# Patient Record
Sex: Male | Born: 1995 | Race: Black or African American | Hispanic: No | Marital: Single | Smoking: Current every day smoker
Health system: Southern US, Community
[De-identification: ages and names within clinical notes are randomized; demographics above are authoritative.]

---
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
## 2016-01-08 ENCOUNTER — Emergency Department (HOSPITAL_COMMUNITY)

## 2016-01-08 ENCOUNTER — Emergency Department (HOSPITAL_COMMUNITY)
Admission: EM | Admit: 2016-01-08 | Discharge: 2016-01-08 | Disposition: A | Discharge status: Home / Self Care | Attending: Emergency Medicine | Admitting: Emergency Medicine

## 2016-01-08 ENCOUNTER — Encounter (HOSPITAL_COMMUNITY): Payer: Self-pay | Admitting: *Deleted

## 2016-01-08 DIAGNOSIS — Y998 Other external cause status: Secondary | ICD-10-CM | POA: Insufficient documentation

## 2016-01-08 DIAGNOSIS — W108XXA Fall (on) (from) other stairs and steps, initial encounter: Secondary | ICD-10-CM | POA: Insufficient documentation

## 2016-01-08 DIAGNOSIS — Y9389 Activity, other specified: Secondary | ICD-10-CM | POA: Insufficient documentation

## 2016-01-08 DIAGNOSIS — W19XXXA Unspecified fall, initial encounter: Secondary | ICD-10-CM

## 2016-01-08 DIAGNOSIS — M25551 Pain in right hip: Secondary | ICD-10-CM

## 2016-01-08 DIAGNOSIS — S31010A Laceration without foreign body of lower back and pelvis without penetration into retroperitoneum, initial encounter: Secondary | ICD-10-CM | POA: Insufficient documentation

## 2016-01-08 DIAGNOSIS — IMO0002 Reserved for concepts with insufficient information to code with codable children: Secondary | ICD-10-CM

## 2016-01-08 DIAGNOSIS — F17210 Nicotine dependence, cigarettes, uncomplicated: Secondary | ICD-10-CM | POA: Insufficient documentation

## 2016-01-08 DIAGNOSIS — Z23 Encounter for immunization: Secondary | ICD-10-CM | POA: Insufficient documentation

## 2016-01-08 DIAGNOSIS — S79922A Unspecified injury of left thigh, initial encounter: Secondary | ICD-10-CM | POA: Insufficient documentation

## 2016-01-08 DIAGNOSIS — Y9289 Other specified places as the place of occurrence of the external cause: Secondary | ICD-10-CM | POA: Insufficient documentation

## 2016-01-08 DIAGNOSIS — S79912A Unspecified injury of left hip, initial encounter: Secondary | ICD-10-CM | POA: Insufficient documentation

## 2016-01-08 MED ORDER — IBUPROFEN 800 MG PO TABS
800.0000 mg | ORAL_TABLET | Freq: Once | ORAL | Status: AC
  Administered 2016-01-08: 800 mg via ORAL
  Filled 2016-01-08: qty 1

## 2016-01-08 MED ORDER — TETANUS-DIPHTH-ACELL PERTUSSIS 5-2.5-18.5 LF-MCG/0.5 IM SUSP
0.5000 mL | Freq: Once | INTRAMUSCULAR | Status: AC
  Administered 2016-01-08: 0.5 mL via INTRAMUSCULAR
  Filled 2016-01-08: qty 0.5

## 2016-01-08 MED ORDER — IBUPROFEN 800 MG PO TABS: 800.0000 mg | ORAL_TABLET | Freq: Three times a day (TID) | ORAL | Status: AC

## 2016-01-08 MED ORDER — LIDOCAINE-EPINEPHRINE (PF) 2 %-1:200000 IJ SOLN
20.0000 mL | Freq: Once | INTRAMUSCULAR | Status: AC
  Administered 2016-01-08: 20 mL
  Filled 2016-01-08: qty 20

## 2016-01-08 NOTE — ED Notes (Signed)
Pt reports fall while going down the steps.  Does not know how many steps.  Pt reports etoh was on board.  It happened around 0200.  Pt presents with lac to L flank area and bruising noted on L lateral thigh.  Pt is able to ambulate

## 2016-01-08 NOTE — Discharge Instructions (Signed)
Take your medications as prescribed as needed for pain relief. I recommend continuing to keep wound clean using soap and water and dry. He may apply antibiotic ointment to wound daily. I also recommend applying ice to her right hip for 15-20 minutes 3-4 times daily as needed for pain relief. Return to the emergency department in 7 days for suture removal. Return to the emergency department sooner if symptoms worsen or new onset of fever, redness, swelling, drainage, warmth.

## 2016-01-08 NOTE — ED Provider Notes (Signed)
CSN: [id number]     Arrival date & time 01/08/16  1622 History  By signing my name below, I, Soijett Blue, attest that this documentation has been prepared under the direction and in the presence of Melburn Hake, PA-C Electronically Signed: Soijett Blue, ED Scribe. 01/08/2016. 7:12 PM.   Chief Complaint  Patient presents with  . Fall  . Laceration      The history is provided by the patient. No language interpreter was used.    Ronnie Foster is a 20 y.o. male who presents to the Emergency Department complaining of fall onset last night at 1 AM. He notes that he tripped and fell while going down an unknown amount of steps. He is unsure of what he cut his left flank on. He notes that he was drinking prior to the incident. He is having associated symptoms of laceration to left flank area and bruising noted to his left lateral thigh. He states that his left lateral thigh pain is worsened with lifting his leg up. He notes that he has not tried any medications for the relief of his symptoms. He denies hitting his head, LOC, fever, drainage, numbness, tingling, HA, back pain, neck pain, abdominal pain, n/v, difficulty urinating, and any other symptoms. Denies allergies or medical issues.   History reviewed. No pertinent past medical history. History reviewed. No pertinent past surgical history. No family history on file. Social History  Substance Use Topics  . Smoking status: Current Every Day Smoker -- 0.00 packs/day    Types: Cigarettes  . Smokeless tobacco: None  . Alcohol Use: Yes     Comment: occasional    Review of Systems  Constitutional: Negative for fever.  Gastrointestinal: Negative for nausea, vomiting and abdominal pain.  Genitourinary: Negative for difficulty urinating.  Musculoskeletal: Negative for back pain and neck pain.  Skin: Positive for color change and wound.  Neurological: Negative for weakness, numbness and headaches.       No tingling  All other systems reviewed  and are negative.    Allergies  Review of patient's allergies indicates no known allergies.  Home Medications   Prior to Admission medications   Medication Sig Start Date End Date Taking? Authorizing Provider  ibuprofen (ADVIL,MOTRIN) 800 MG tablet Take 1 tablet (800 mg total) by mouth 3 (three) times daily. 01/08/16   Satira Sark Pepper Kerrick, PA-C   BP 127/64 mmHg  Pulse 95  Temp(Src) 98.7 F (37.1 C) (Oral)  Resp 18  Ht  (1.727 m)  Wt 81.647 kg  BMI 27.38 kg/m2  SpO2 98% Physical Exam  Constitutional: He is oriented to person, place, and time. He appears well-developed and well-nourished. No distress.  HENT:  Head: Normocephalic and atraumatic.  Eyes: EOM are normal.  Neck: Neck supple.  Cardiovascular: Normal rate, regular rhythm and normal heart sounds.  Exam reveals no gallop and no friction rub.   No murmur heard. Pulmonary/Chest: Effort normal and breath sounds normal. No respiratory distress. He has no wheezes. He has no rales.  Abdominal: Soft. There is no tenderness. There is no CVA tenderness. Hernia confirmed negative in the right inguinal area and confirmed negative in the left inguinal area.  No pelvic instability.  Musculoskeletal:       Left hip: He exhibits decreased range of motion and tenderness.       Left knee: Normal.       Left ankle: Normal.  5 cm linear laceration noted to left lower lateral back. No active bleeding. No  surrounding swelling, redness, warmth, or drainage.  Left anterior hip TTP. Decreased ROM of hip with flexion and extension due to pain. Full abduction and adduction internal and external rotation. Sensation intact. 2+ pt pulses. Decreased strength due to pain. Pt able to stand but limps when ambulating due to pain. Large ecchymosis noted to left posterior thigh, non-tender. Full ROM of left knee, foot, and ankle.  Lymphadenopathy:       Right: No inguinal adenopathy present.       Left: No inguinal adenopathy present.   Neurological: He is alert and oriented to person, place, and time.  Skin: Skin is warm and dry.  Psychiatric: He has a normal mood and affect. His behavior is normal.  Nursing note and vitals reviewed.   ED Course  Procedures (including critical care time) DIAGNOSTIC STUDIES: Oxygen Saturation is 99% on RA, nl by my interpretation.    COORDINATION OF CARE: 7:10 PM Discussed treatment plan with pt at bedside which includes left hip xray, laceration repair, and pt agreed to plan.  LACERATION REPAIR PROCEDURE NOTE The patient's identification was confirmed and consent was obtained. This procedure was performed by Melburn Hake, PA-C at 7:52 PM. Site: Left lower lateral back Sterile procedures observed: YES Anesthetic used (type and amt): 2 % Lidocaine with Epinephrine and 8 ml used Suture type/size:4-0 Prolene Length: 6 cm # of Sutures: 10 Technique:Simple interrupted Complexity: SImple Antibx ointment applied: bacitracin  Tetanus UTD or ordered: YES Site anesthetized, irrigated with NS, explored without evidence of foreign body, wound well approximated, site covered with dry, sterile dressing.  Patient tolerated procedure well without complications. Instructions for care discussed verbally and patient provided with additional written instructions for homecare and f/u.   Labs Review Labs Reviewed - No data to display  Imaging Review Dg Hip Unilat With Pelvis 1v Left  01/08/2016  CLINICAL DATA:  Left hip pain, fall down steps EXAM: DG HIP (WITH OR WITHOUT PELVIS) 1V*L* COMPARISON:  None. FINDINGS: No fracture or dislocation is seen. Bilateral hip joint spaces are within normal limits. Visualized bony pelvis appears intact. Visualized soft tissues are within normal limits. IMPRESSION: No fracture or dislocation is seen. Electronically Signed   By: Charline Bills M.D.   On: 01/08/2016 19:55   I have personally reviewed and evaluated these images as part of my medical  decision-making.  Filed Vitals:   01/08/16 1927 01/08/16 1928  BP:  127/64  Pulse:  95  Temp: 98.7 F (37.1 C) 98.7 F (37.1 C)  Resp:       MDM   Final diagnoses:  Fall, initial encounter  Laceration  Right hip pain   Patient presents with left hip pain that occurred after falling last night. He notes he was intoxicated during the time, denies head injury or LOC. Large ecchymoses noted to left posterior thigh, tenderness to left anterior thigh with decreased range of motion of left hip due to pain. No other signs of injury/trauma. Left hip x-ray negative. Plan to discharge patient home with symptomatic treatment.  Laceration to left back that also occurred during reported fall. No surrounding erythema, swelling, warmth or drainage. Denies fever. VSS. Pressure irrigation performed. Wound explored and base of wound visualized in a bloodless field without evidence of foreign body.  Laceration occurred < 18 hours prior to repair which was well tolerated. Tdap updated.  Pt has no comorbidities to effect normal wound healing. Pt discharged without antibiotics.  Discussed suture home care with patient and answered questions. Pt  to follow-up for wound check and suture removal in 7 days; they are to return to the ED sooner for signs of infection. Pt is hemodynamically stable with no complaints prior to dc.    I personally performed the services described in this documentation, which was scribed in my presence. The recorded information has been reviewed and is accurate.    Satira Sark [place], [place] 01/08/16 2028  Gwyneth Sprout, MD 01/09/16 [phone]

## 2016-01-08 NOTE — ED Notes (Signed)
Patient transported to X-ray 

## 2017-08-02 IMAGING — CR DG HIP (WITH OR WITHOUT PELVIS) 1V*L*
3 series · 3 of 3 positions shown · non-contrast
Comparison: None.

CLINICAL DATA: Left hip pain, fall down steps

EXAM:
DG HIP (WITH OR WITHOUT PELVIS) 1V*L*

[t pelvis ap]
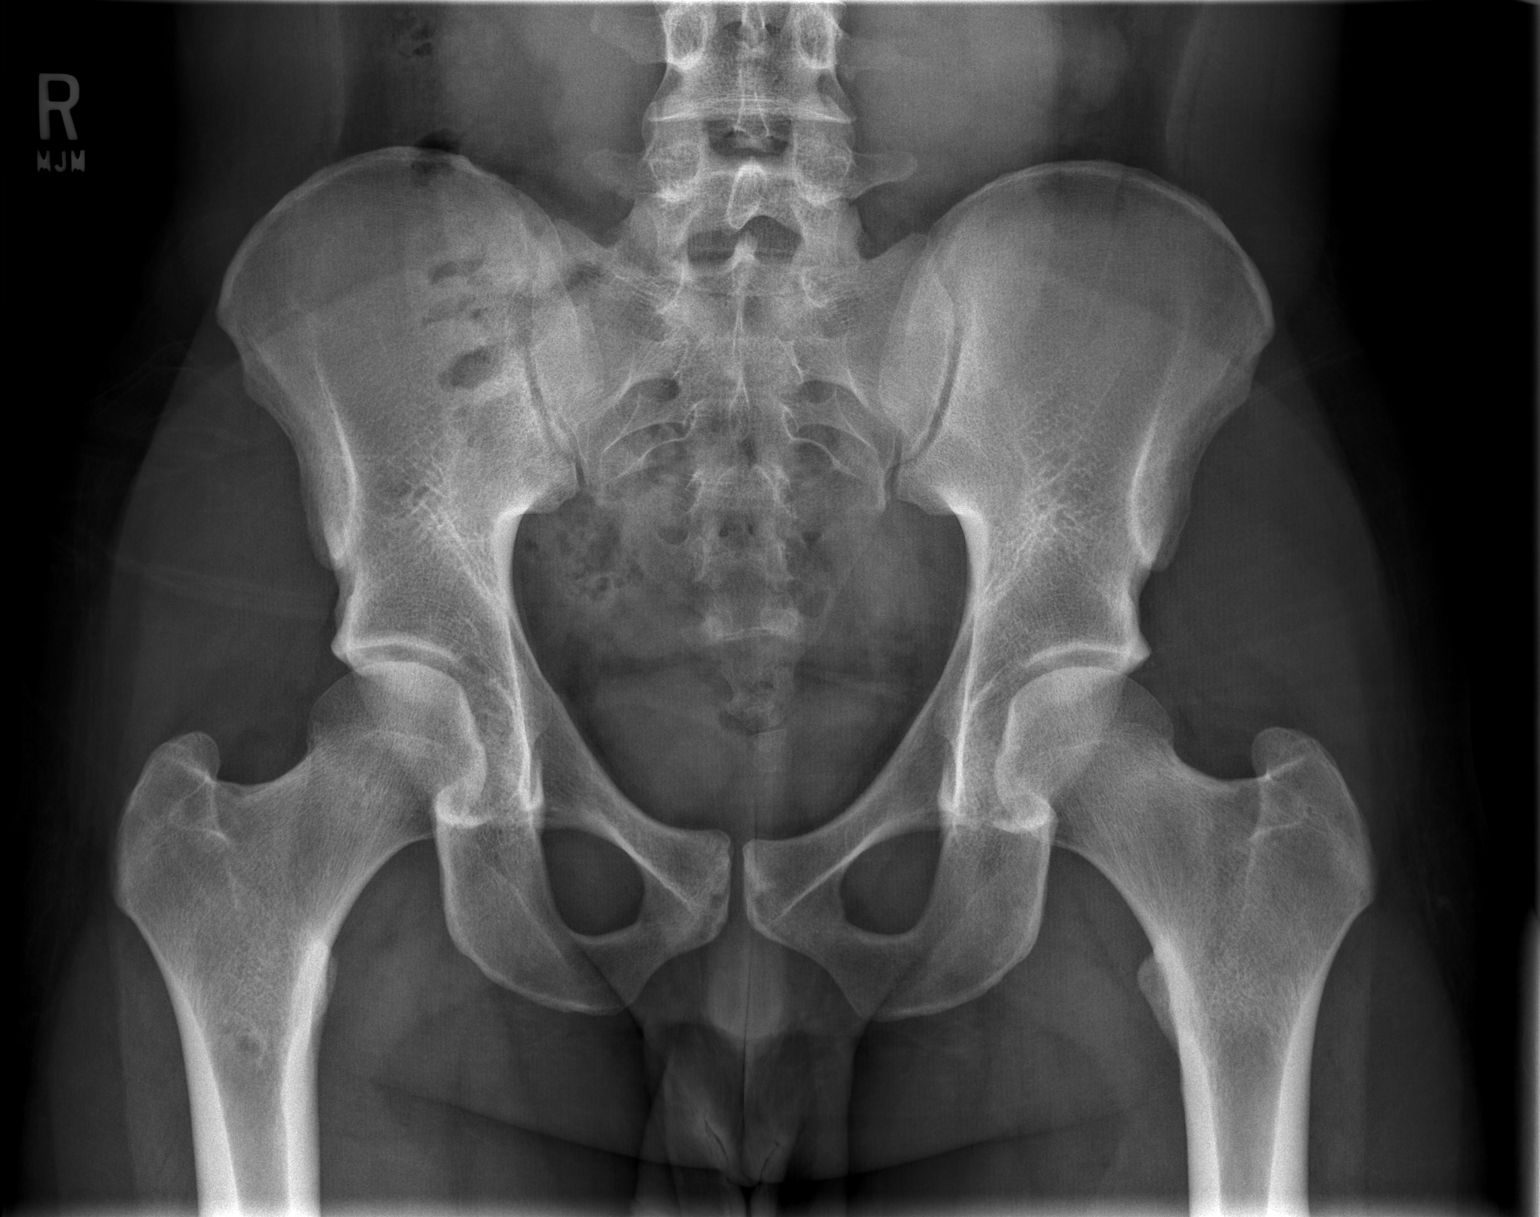

[t hip ap left]
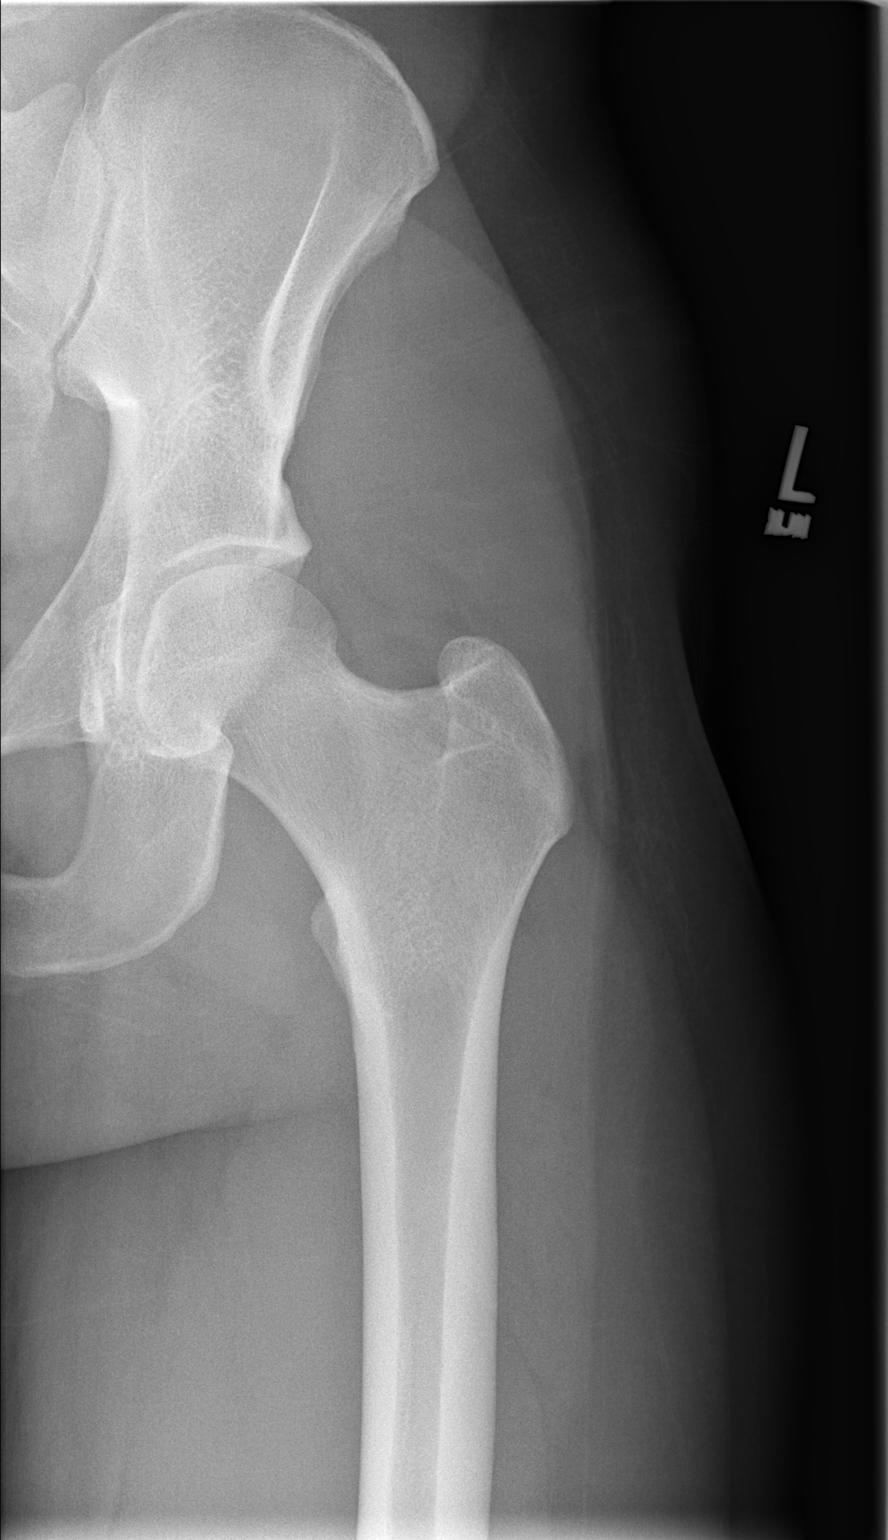

[t hip frog leg left]
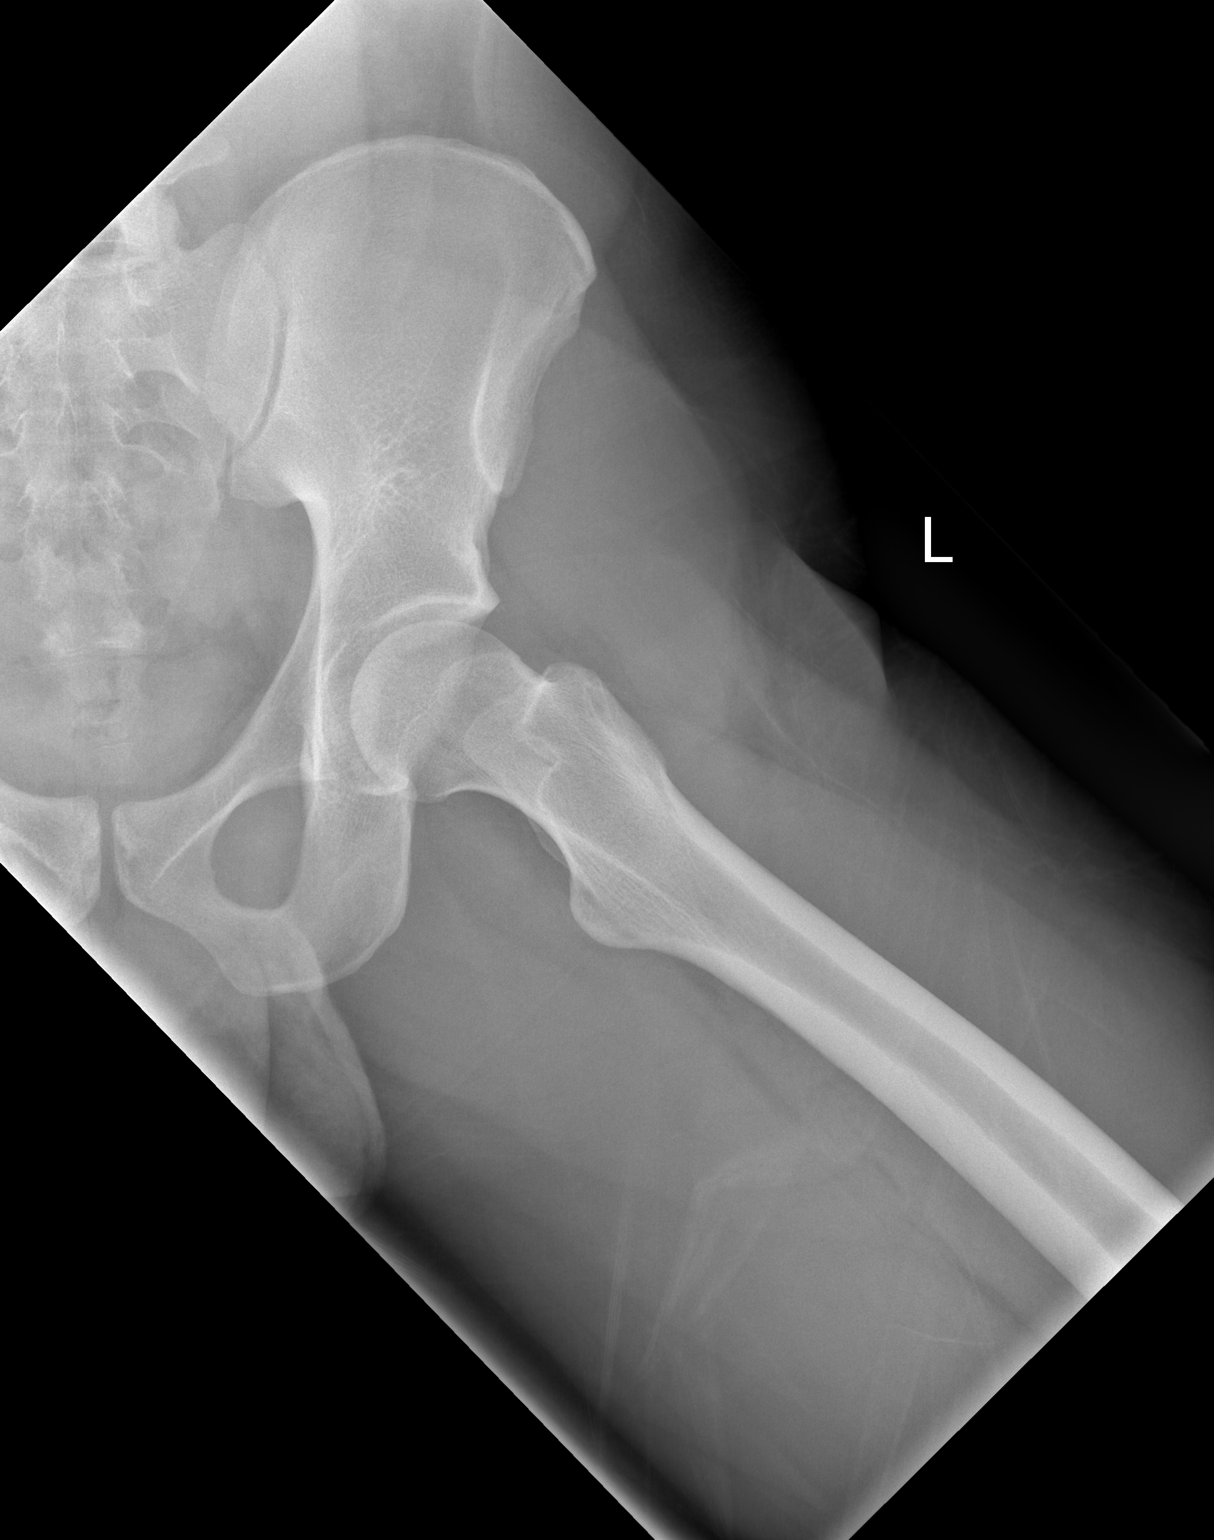

[3 of 3 positions shown; findings below may reference images not displayed]

FINDINGS: No fracture or dislocation is seen.

Bilateral hip joint spaces are within normal limits.

Visualized bony pelvis appears intact.

Visualized soft tissues are within normal limits.
IMPRESSION: No fracture or dislocation is seen.
